# Patient Record
Sex: Female | Born: 1960 | Race: White | Hispanic: No | Smoking: Current every day smoker
Health system: Southern US, Community
[De-identification: ages and names within clinical notes are randomized; demographics above are authoritative.]

## PROBLEM LIST (undated history)

## (undated) DIAGNOSIS — S32000A Wedge compression fracture of unspecified lumbar vertebra, initial encounter for closed fracture: Secondary | ICD-10-CM

---

## 2005-12-23 ENCOUNTER — Ambulatory Visit: Payer: Self-pay | Admitting: Internal Medicine

## 2011-02-10 ENCOUNTER — Ambulatory Visit: Payer: Self-pay

## 2017-01-10 ENCOUNTER — Emergency Department: Payer: Self-pay

## 2017-01-10 ENCOUNTER — Emergency Department
Admission: EM | Admit: 2017-01-10 | Discharge: 2017-01-10 | Disposition: A | Discharge status: Home / Self Care | Payer: Self-pay | Attending: Emergency Medicine | Admitting: Emergency Medicine

## 2017-01-10 ENCOUNTER — Encounter: Payer: Self-pay | Admitting: Emergency Medicine

## 2017-01-10 DIAGNOSIS — Y929 Unspecified place or not applicable: Secondary | ICD-10-CM | POA: Insufficient documentation

## 2017-01-10 DIAGNOSIS — W010XXA Fall on same level from slipping, tripping and stumbling without subsequent striking against object, initial encounter: Secondary | ICD-10-CM | POA: Insufficient documentation

## 2017-01-10 DIAGNOSIS — S92811A Other fracture of right foot, initial encounter for closed fracture: Secondary | ICD-10-CM

## 2017-01-10 DIAGNOSIS — S93402A Sprain of unspecified ligament of left ankle, initial encounter: Secondary | ICD-10-CM | POA: Insufficient documentation

## 2017-01-10 DIAGNOSIS — F17200 Nicotine dependence, unspecified, uncomplicated: Secondary | ICD-10-CM | POA: Insufficient documentation

## 2017-01-10 DIAGNOSIS — S92191A Other fracture of right talus, initial encounter for closed fracture: Secondary | ICD-10-CM | POA: Insufficient documentation

## 2017-01-10 DIAGNOSIS — Y999 Unspecified external cause status: Secondary | ICD-10-CM | POA: Insufficient documentation

## 2017-01-10 DIAGNOSIS — Y9301 Activity, walking, marching and hiking: Secondary | ICD-10-CM | POA: Insufficient documentation

## 2017-01-10 HISTORY — DX: Wedge compression fracture of unspecified lumbar vertebra, initial encounter for closed fracture: S32.000A

## 2017-01-10 MED ORDER — KETOROLAC TROMETHAMINE 30 MG/ML IJ SOLN
30.0000 mg | Freq: Once | INTRAMUSCULAR | Status: AC
  Administered 2017-01-10: 30 mg via INTRAMUSCULAR
  Filled 2017-01-10: qty 1

## 2017-01-10 MED ORDER — BACITRACIN ZINC 500 UNIT/GM EX OINT
TOPICAL_OINTMENT | CUTANEOUS | Status: AC
  Administered 2017-01-10: 1 via TOPICAL
  Filled 2017-01-10: qty 0.9

## 2017-01-10 MED ORDER — KETOROLAC TROMETHAMINE 10 MG PO TABS: 10.0000 mg | ORAL_TABLET | Freq: Four times a day (QID) | ORAL | 0 refills | Status: AC | PRN

## 2017-01-10 MED ORDER — BACITRACIN ZINC 500 UNIT/GM EX OINT
TOPICAL_OINTMENT | Freq: Once | CUTANEOUS | Status: AC
  Administered 2017-01-10: 1 via TOPICAL

## 2017-01-10 NOTE — ED Notes (Signed)
Splints applied on both rt foot and lf ankle  Bacitracin applied to lf thigh  Lm edt

## 2017-01-10 NOTE — ED Provider Notes (Signed)
[facility] Emergency Department Provider Note   ____________________________________________   I have reviewed the triage vital signs and the nursing notes.   HISTORY  Chief Complaint Foot Pain    HPI Lauren Oliver is a 56 y.o. female presents to the emergency department with right foot pain and left ankle pain and swelling after falling on a deck and having her feet stuck between the deck steps. Patient noted pain and inability to weight-bear through bilateral feet following the injuries due to severe pain. Patient denies any past history of ankle or foot injuries to either foot. Patient localizes pain along the right foot along the dorsal aspect approximately at the talus. She localizes left foot pain along the left lateral malleoli. Both areas of pain have significant swelling and ecchymosis. Patient denies fever, chills, headache, vision changes, chest pain, chest tightness, shortness of breath, abdominal pain, nausea and vomiting.  Past Medical History:  Diagnosis Date  . Lumbar compression fracture (HCC)     There are no active problems to display for this patient.   History reviewed. No pertinent surgical history.  Prior to Admission medications   Medication Sig Start Date End Date Taking? Authorizing Provider  ketorolac (TORADOL) 10 MG tablet Take 1 tablet (10 mg total) by mouth every 6 (six) hours as needed. 01/10/17 01/15/17  Kyleigh Nannini M, PA-C    Allergies Codeine  No family history on file.  Social History Social History  Substance Use Topics  . Smoking status: Current Every Day Smoker  . Smokeless tobacco: Current User  . Alcohol use Yes     Comment: socially    Review of Systems Constitutional: Negative for fever/chills Eyes: No visual changes. ENT:  Negative for sore throat and for difficulty swallowing Cardiovascular: Denies chest pain. Respiratory: Denies cough. Denies shortness of breath. Gastrointestinal: No  abdominal pain.  No nausea, vomiting, diarrhea. Genitourinary: Negative for dysuria. Musculoskeletal: Right foot pain and swelling. Left lateral ankle pain and swelling. Skin: Negative for rash. Neurological: Negative for headaches.  Negative focal weakness or numbness. Negative for loss of consciousness. Severe pain with ambulation. ____________________________________________   PHYSICAL EXAM:  VITAL SIGNS: ED Triage Vitals  Enc Vitals Group     BP 01/10/17 1206 114/82     Pulse Rate 01/10/17 1206 83     Resp 01/10/17 1206 16     Temp 01/10/17 1206 99.4 F (37.4 C)     Temp Source 01/10/17 1206 Oral     SpO2 01/10/17 1206 97 %     Weight 01/10/17 1206 170 lb (77.1 kg)     Height 01/10/17 1206 5\' 4"  (1.626 m)     Head Circumference --      Peak Flow --      Pain Score 01/10/17 1212 8     Pain Loc --      Pain Edu? --      Excl. in GC? --     Constitutional: Alert and oriented. Well appearing and in no acute distress.  Head: Normocephalic and atraumatic. Eyes: Conjunctivae are normal. PERRL. Normal extraocular movements. Ears: Canals clear. TMs intact bilaterally. Nose: No congestion/rhinorrhea/epistaxis. Mouth/Throat: Mucous membranes are moist.  Neck: Supple.  Cardiovascular: Normal rate, regular rhythm. Normal distal pulses. Respiratory: Normal respiratory effort.  Gastrointestinal: Soft and nontender. No distention. Musculoskeletal: Right ankle ROM intact with pain. Palpable tenderness along talus with swelling and ecchymosis associated. Left ankle ROM intact with pain. Lateral malleoli tenderness over the anterior and calcaneal talofibular ligament  with significant swelling and ecchymosis. Sensation intact in bilateral feet. Otherwise, nontender with normal range of motion in all extremities. Neurologic: Normal speech and language. No gross focal neurologic deficits are appreciated. No gait instability.  Skin:  Skin is warm, dry and intact. No rash noted. Psychiatric:  Mood and affect are normal.  ____________________________________________   LABS (all labs ordered are listed, but only abnormal results are displayed)  Labs Reviewed - No data to display ____________________________________________  EKG none ____________________________________________  RADIOLOGY DG foot complete right IMPRESSION: 1. Small avulsion fracture of the anterior talus. 2. Subtle dorsal cortical irregularity of the middle cuneiform. Nondisplaced/ tiny avulsion fracture not excluded. 3. Dorsal soft tissue swelling.  DG ankle complete left IMPRESSION: 1. Lateral soft tissue swelling without fracture suggesting ligamentous injury. ____________________________________________   PROCEDURES  Procedure(s) performed:  SPLINT APPLICATION Date/Time: 7:04 PM Authorized by: Clois Comber Consent: Verbal consent obtained. Risks and benefits: risks, benefits and alternatives were discussed Consent given by: patient Splint applied by: ED/EMT technician Location details: Right foot Splint type: posterior short leg Supplies used: Ortho-glass, cast padding and ACE wrap Post-procedure: The splinted body part was neurovascularly unchanged following the procedure. Patient tolerance: Patient tolerated the procedure well with no immediate complications. Initial fracture care was provided. Follow up will be greater than 24 hours.  SPLINT APPLICATION Date/Time: 7:04 PM Authorized by: Clois Comber Consent: Verbal consent obtained. Risks and benefits: risks, benefits and alternatives were discussed Consent given by: patient Splint applied by: ED/EMT technician Location details: Left ankle Splint type: Ortho Glass ankle stir-up splint Supplies used: Ortho Glass ankle stir-up splint with ACE wrap Post-procedure: The splinted body part was neurovascularly unchanged following the procedure. Patient tolerance: Patient tolerated the procedure well with no immediate  complications.   Critical Care performed: no ____________________________________________   INITIAL IMPRESSION / ASSESSMENT AND PLAN / ED COURSE  Pertinent labs & imaging results that were available during my care of the patient were reviewed by me and considered in my medical decision making (see chart for details).  Patient presented to the emergency department with right foot and left ankle pain secondary to a fall yesterday. Patient history, physical exam and imaging are reassuring findings are consistent with small avulsion fracture of the anterior talus of the right foot and lateral ankle sprain of the left ankle. The right foot stabilized with posterior short leg splint and the left ankle stabilized with Ortho-glass stir-up splint. Bilateral lower extremities were neurovasculature intact following splint application. Patient given a walker to assist with mobility. Patient was advised to follow up with Orthopedics for continued care and was also advised to return to the emergency department for symptoms that change or worsen. Patient informed of clinical course, understand medical decision-making process, and agree with plan.     ____________________________________________   FINAL CLINICAL IMPRESSION(S) / ED DIAGNOSES  Final diagnoses:  Other fracture of right foot, initial encounter for closed fracture  Sprain of left ankle, unspecified ligament, initial encounter       NEW MEDICATIONS STARTED DURING THIS VISIT:  Discharge Medication List as of 01/10/2017  2:25 PM    START taking these medications   Details  ketorolac (TORADOL) 10 MG tablet Take 1 tablet (10 mg total) by mouth every 6 (six) hours as needed., Starting Sun 01/10/2017, Until Fri 01/15/2017, Print         Note:  This document was prepared using Dragon voice recognition software and may include unintentional dictation errors.    Bonna Steury,  Jordan Likesraci M, PA-C 01/10/17 Elwin [id number]    [place], [place], MD 01/12/17  [phone]

## 2017-01-10 NOTE — Discharge Instructions (Signed)
Take medications as prescribed. If you notice increased pain, numbness, tingling of the foot or ankle return to the emergency department to have your splints reassessed.

## 2017-01-10 NOTE — ED Triage Notes (Signed)
Pt fell on deck yesterday reports both feet got stuck between steps on deck swellign to both ankles and pain to right foot, palpable pulses

## 2017-01-10 NOTE — ED Notes (Signed)

## 2018-09-25 IMAGING — DX DG ANKLE COMPLETE 3+V*L*
3 series · 3 of 3 positions shown · non-contrast
Comparison: 10/24/2001 by report only

CLINICAL DATA: Scrapes along the foot and lower legs

EXAM:
LEFT ANKLE COMPLETE - 3+ VIEW

[ankle ap]
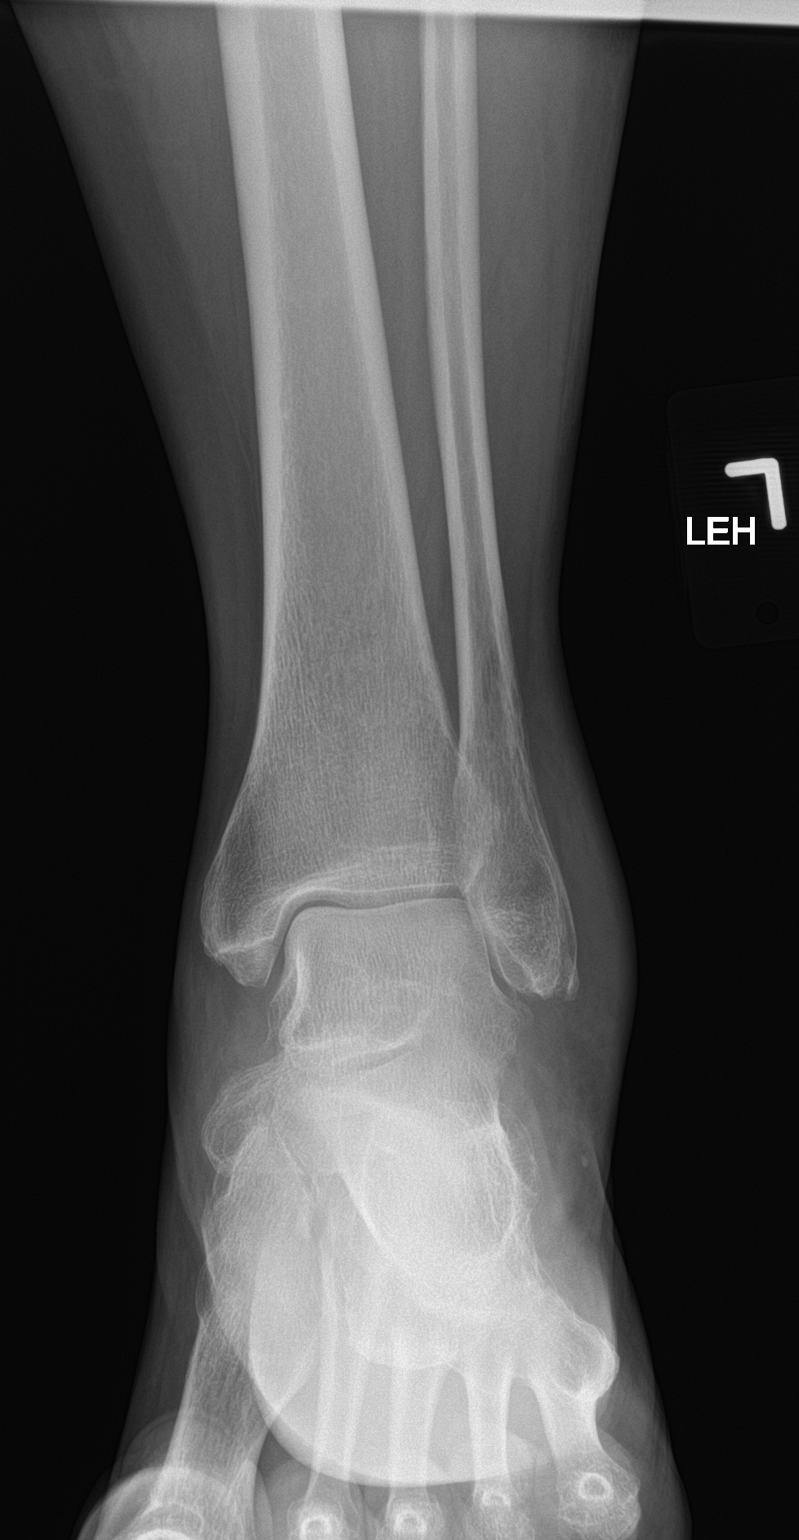

[ankle obl]
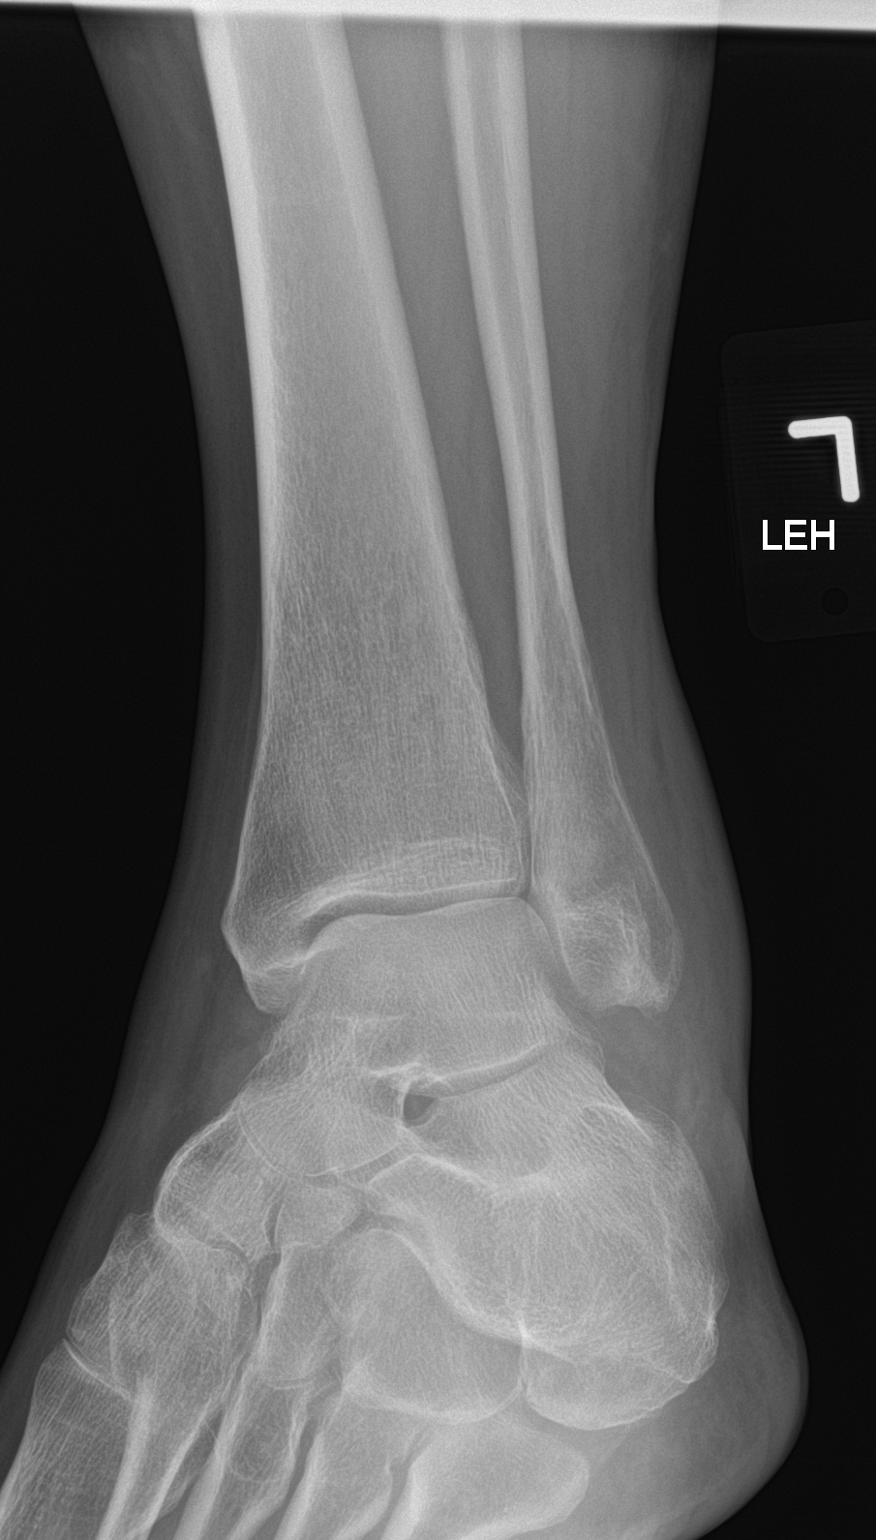

[ankle lat]
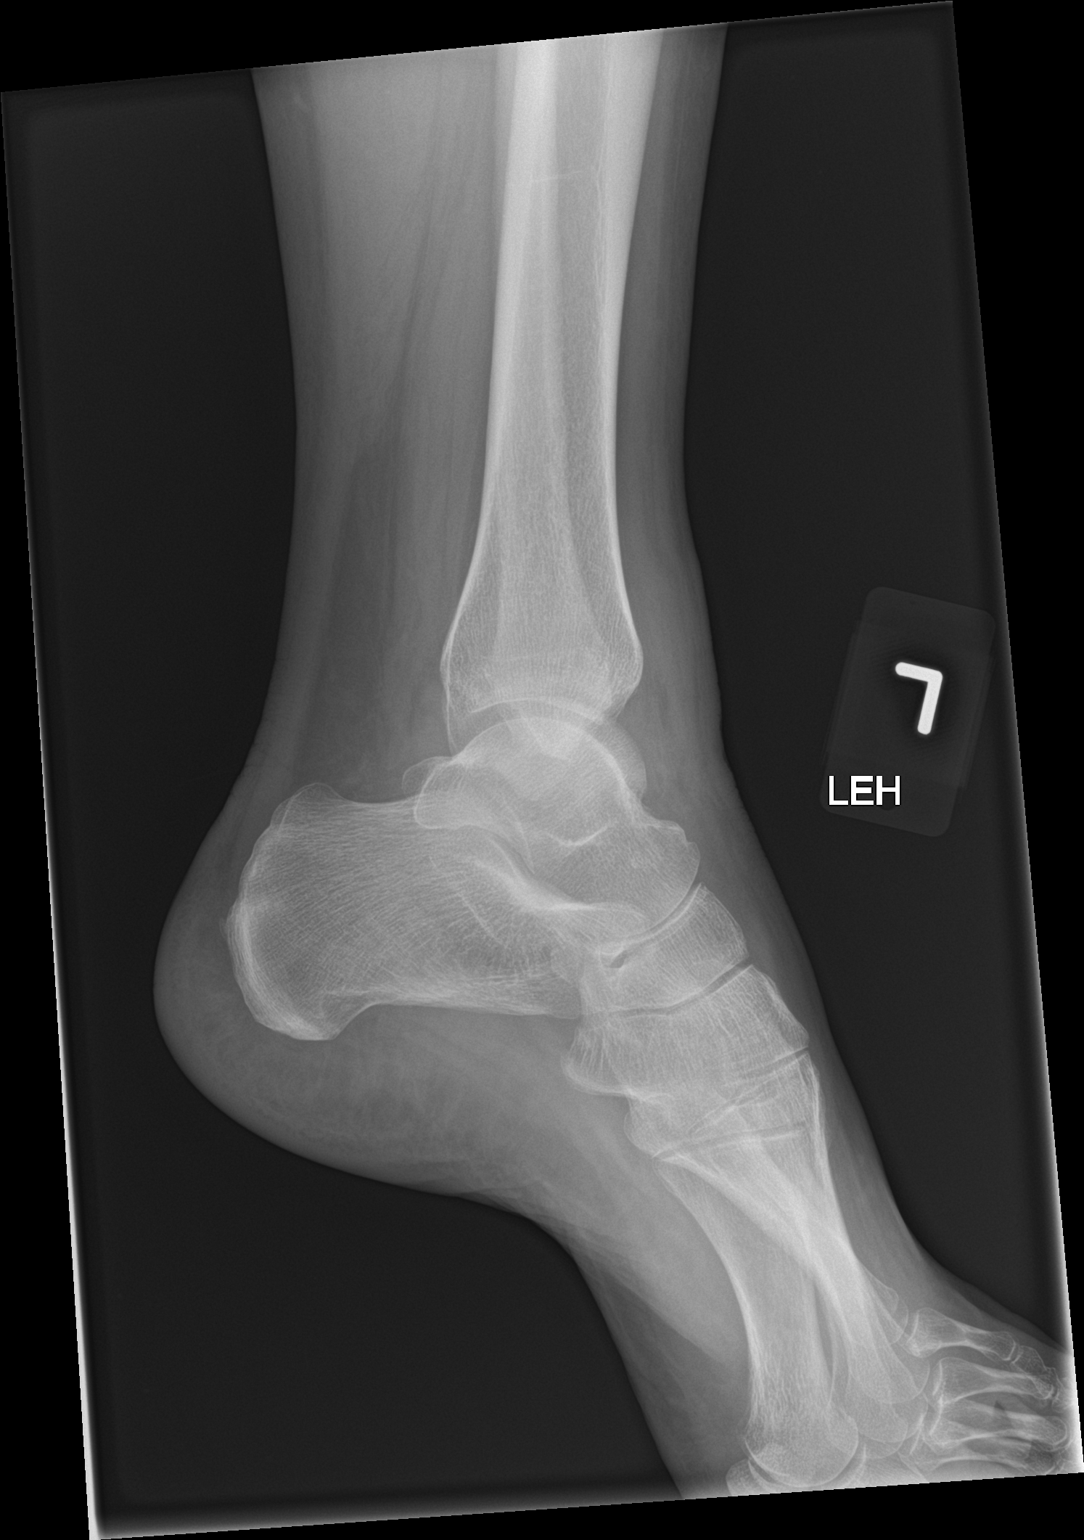

[3 of 3 positions shown; findings below may reference images not displayed]

FINDINGS: There is no evidence of fracture, dislocation, or joint effusion.
There is no evidence of arthropathy or other focal bone abnormality.
There is lateral soft tissue swelling.
IMPRESSION: 1. Lateral soft tissue swelling without fracture suggesting
ligamentous injury.

## 2018-09-25 IMAGING — DX DG FOOT COMPLETE 3+V*R*
3 series · 3 of 3 positions shown · non-contrast
Comparison: None.

CLINICAL DATA: Fall.  Right foot pain.  Initial encounter.

EXAM:
RIGHT FOOT COMPLETE - 3+ VIEW

[foot ap]
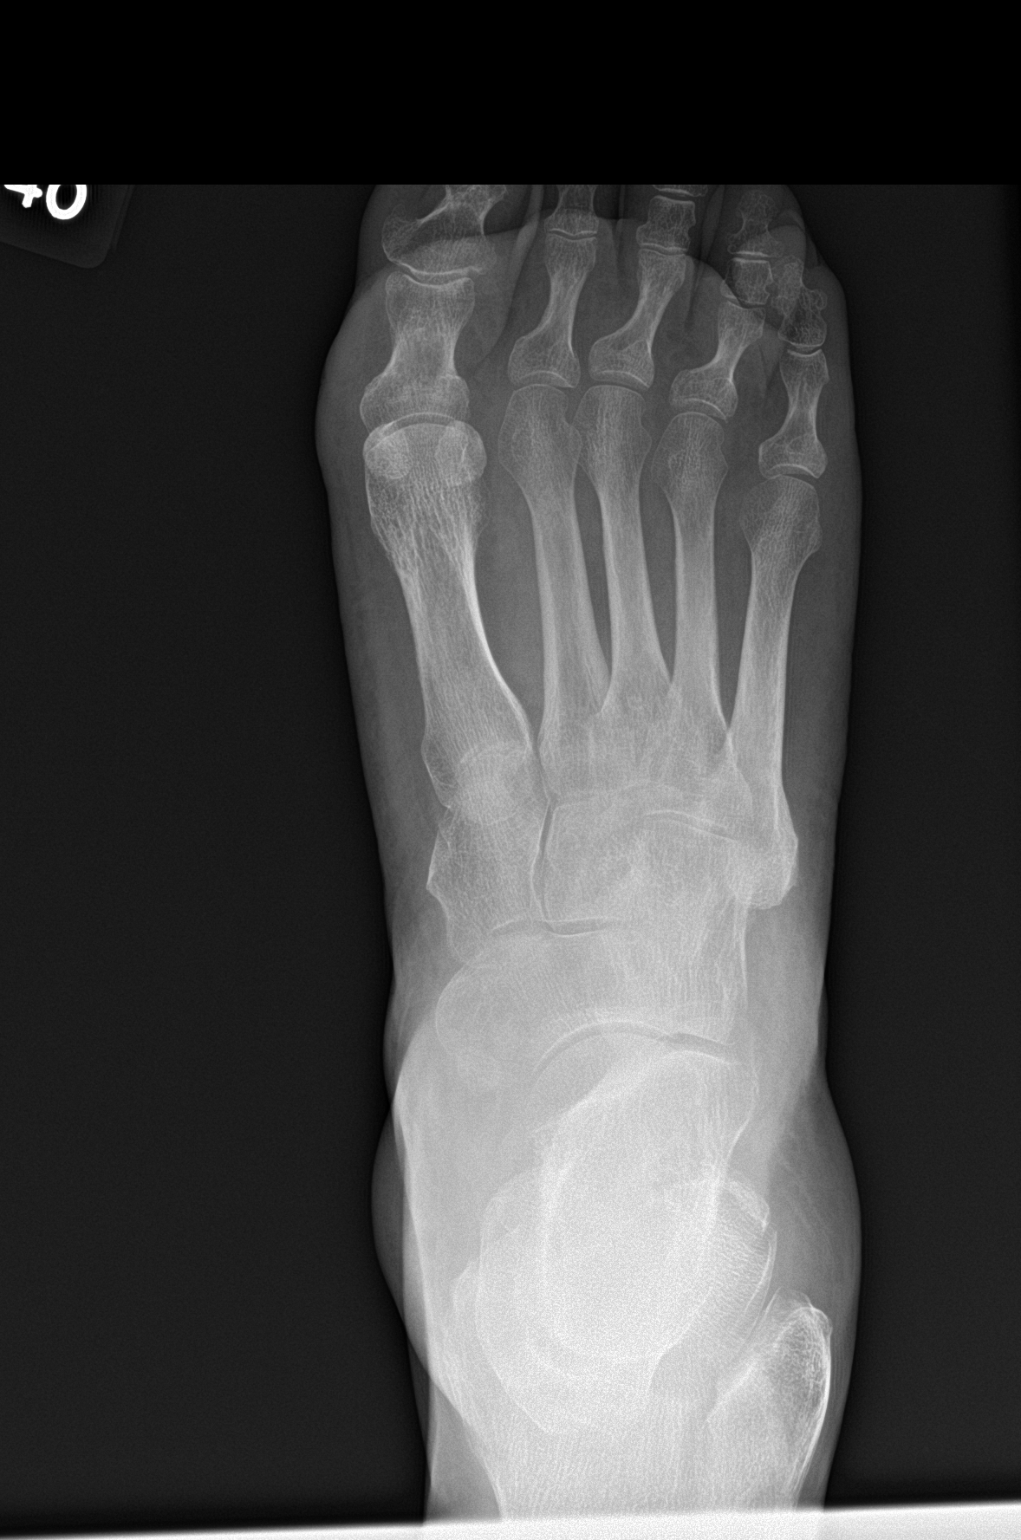

[foot obl]
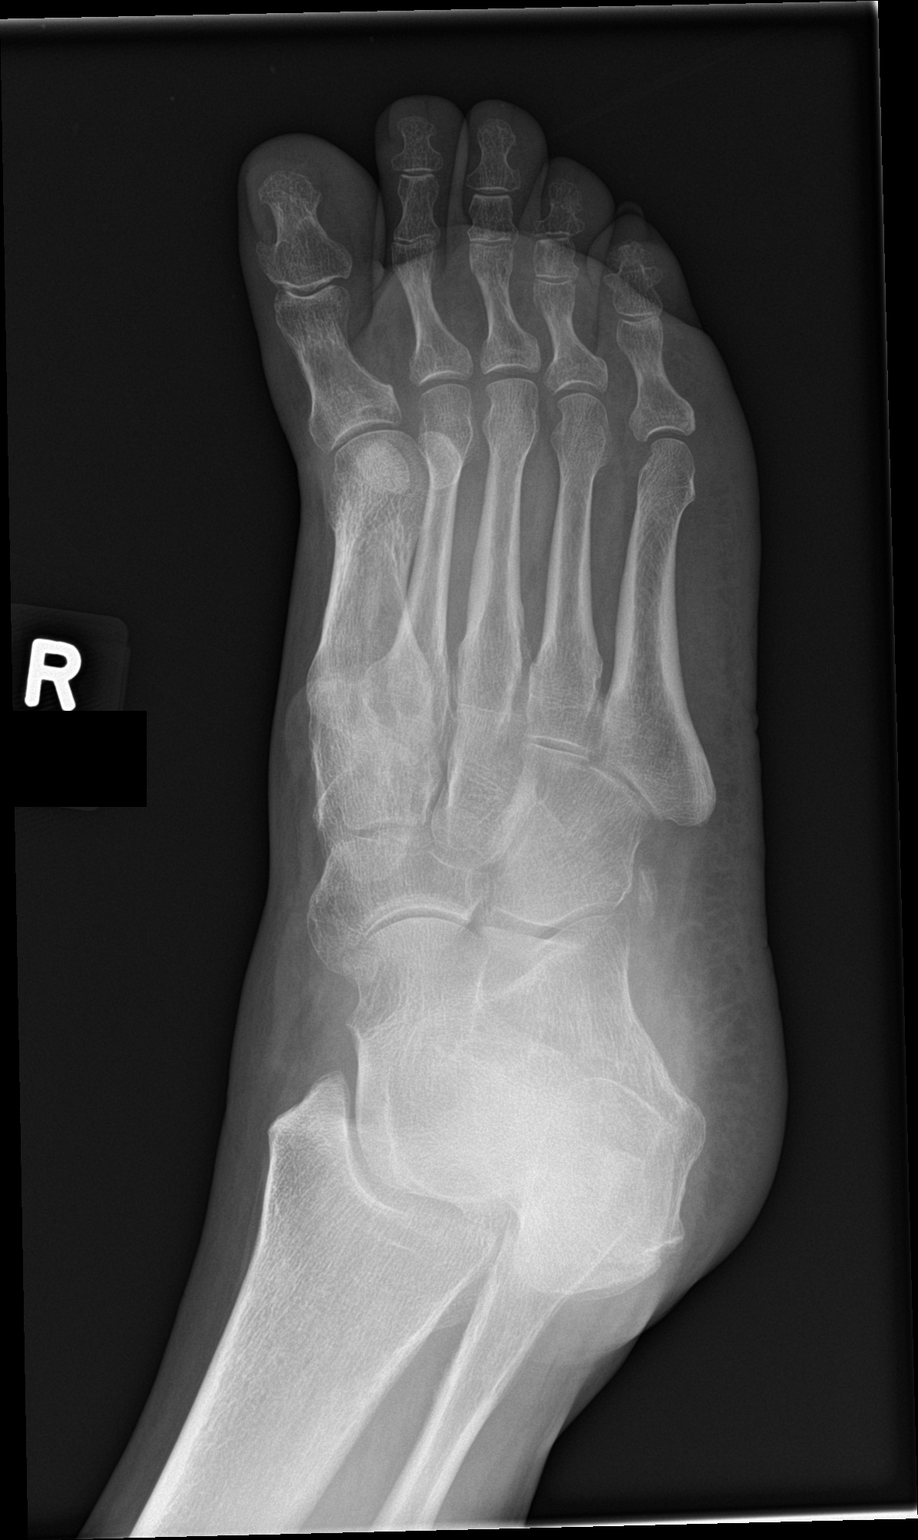

[foot lat]
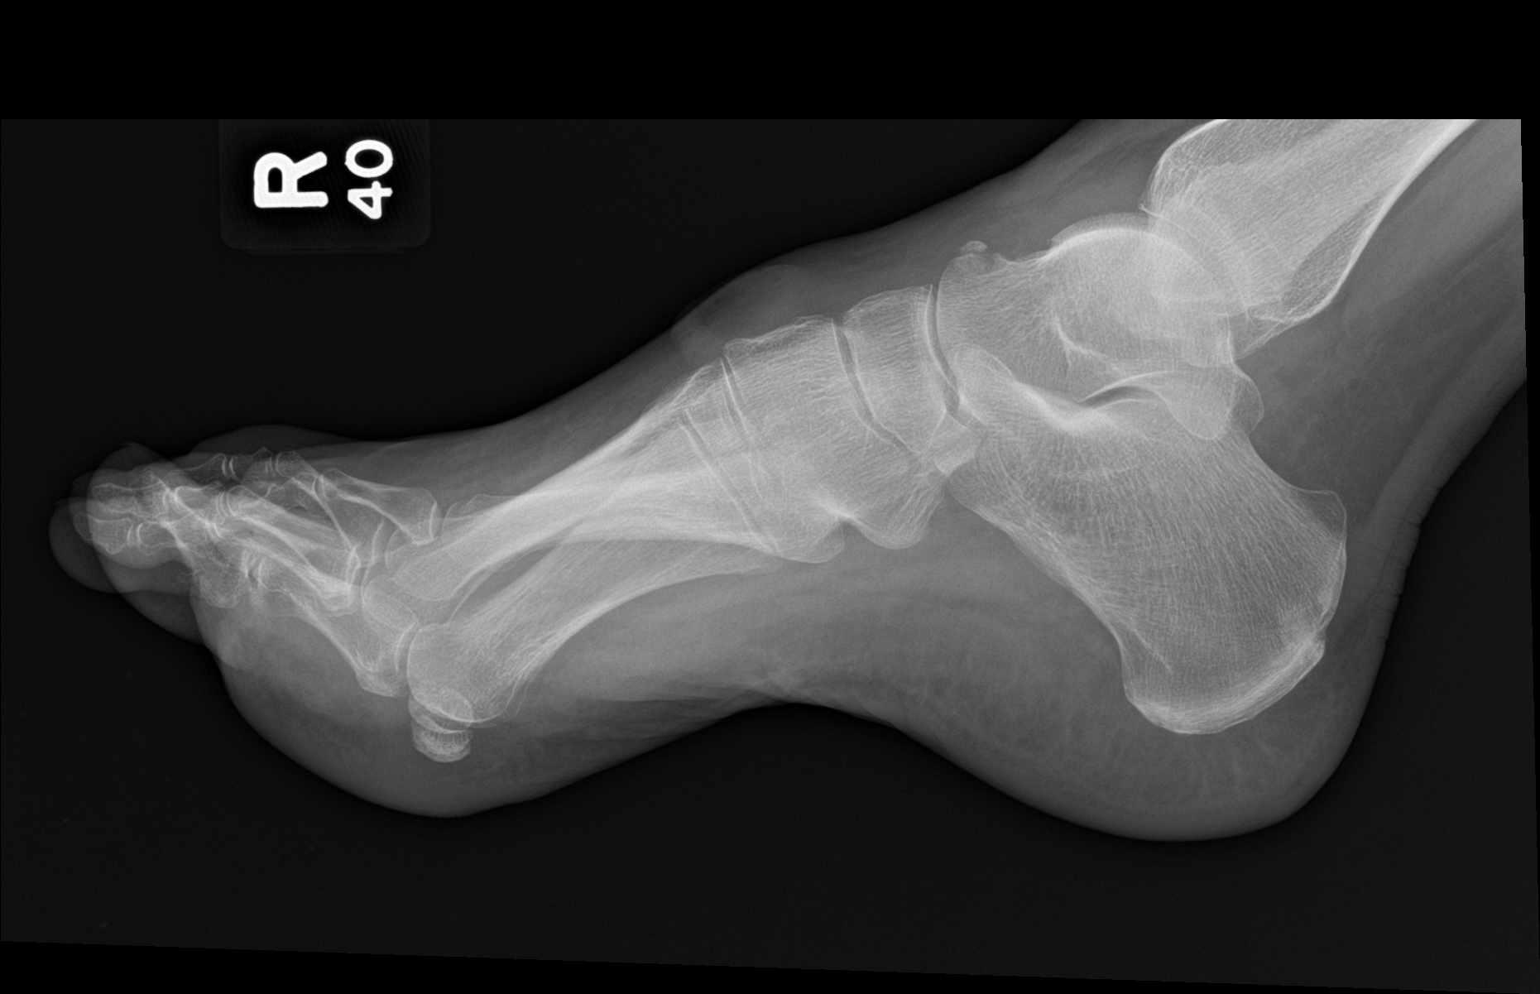

[3 of 3 positions shown; findings below may reference images not displayed]

FINDINGS: There is a 5 mm osseous fragment along the dorsal aspect of the
anterior talus. There is also subtle irregularity and possible focal
cortical disruption of the dorsal aspect of the middle cuneiform on
the lateral radiograph with overlying soft tissue swelling. No
displaced fracture is identified in this location. Dorsal soft
tissue swelling extends into the ankle. There is no dislocation.
IMPRESSION: 1. Small avulsion fracture of the anterior talus.
2. Subtle dorsal cortical irregularity of the middle cuneiform.
Nondisplaced/ tiny avulsion fracture not excluded.
3. Dorsal soft tissue swelling.

## 2019-04-05 ENCOUNTER — Other Ambulatory Visit: Payer: Self-pay | Admitting: *Deleted

## 2019-04-05 DIAGNOSIS — Z20822 Contact with and (suspected) exposure to covid-19: Secondary | ICD-10-CM

## 2019-04-06 LAB — NOVEL CORONAVIRUS, NAA: SARS-CoV-2, NAA: NOT DETECTED

## 2019-09-09 ENCOUNTER — Ambulatory Visit: Payer: Self-pay | Attending: Internal Medicine

## 2019-09-09 DIAGNOSIS — Z23 Encounter for immunization: Secondary | ICD-10-CM

## 2019-09-09 NOTE — Progress Notes (Signed)
   Covid-19 Vaccination Clinic  Name:  Andalyn Heckstall    MRN: [id number] DOB: [birth year]  09/09/2019  Ms. Lovins was observed post Covid-19 immunization for 15 minutes without incident. She was provided with Vaccine Information Sheet and instruction to access the V-Safe system.   Ms. Semidey was instructed to call 911 with any severe reactions post vaccine: Marland Kitchen Difficulty breathing  . Swelling of face and throat  . A fast heartbeat  . A bad rash all over body  . Dizziness and weakness   Immunizations Administered    Name Date Dose VIS Date Route   Pfizer COVID-19 Vaccine 09/09/2019  4:54 PM 0.3 mL 05/26/2019 Intramuscular   Manufacturer: [employer], [employer]   Lot: [id number]   NDC: [address]-4

## 2019-09-30 ENCOUNTER — Other Ambulatory Visit: Payer: Self-pay

## 2019-09-30 ENCOUNTER — Ambulatory Visit: Payer: Self-pay | Attending: Internal Medicine

## 2019-09-30 DIAGNOSIS — Z23 Encounter for immunization: Secondary | ICD-10-CM

## 2019-09-30 NOTE — Progress Notes (Signed)
   Covid-19 Vaccination Clinic  Name:  Lauren Oliver    MRN: [id number] DOB: February 18, 1961  09/30/2019  Lauren Oliver was observed post Covid-19 immunization for 15 minutes without incident. She was provided with Vaccine Information Sheet and instruction to access the V-Safe system.   Lauren Oliver was instructed to call 911 with any severe reactions post vaccine: Marland Kitchen Difficulty breathing  . Swelling of face and throat  . A fast heartbeat  . A bad rash all over body  . Dizziness and weakness   Immunizations Administered    Name Date Dose VIS Date Route   Pfizer COVID-19 Vaccine 09/30/2019  4:39 PM 0.3 mL 05/26/2019 Intramuscular   Manufacturer: [employer], [employer]   Lot: [id number]   NDC: [address]-7
